# Patient Record
Sex: Male | Born: 1975 | Race: White | Hispanic: No | Marital: Married | Smoking: Never smoker
Health system: Southern US, Community
[De-identification: ages and names within clinical notes are randomized; demographics above are authoritative.]

## PROBLEM LIST (undated history)

## (undated) DIAGNOSIS — I4891 Unspecified atrial fibrillation: Secondary | ICD-10-CM

## (undated) DIAGNOSIS — K219 Gastro-esophageal reflux disease without esophagitis: Secondary | ICD-10-CM

## (undated) DIAGNOSIS — J302 Other seasonal allergic rhinitis: Secondary | ICD-10-CM

## (undated) DIAGNOSIS — I10 Essential (primary) hypertension: Secondary | ICD-10-CM

## (undated) HISTORY — DX: Other seasonal allergic rhinitis: J30.2

## (undated) HISTORY — DX: Unspecified atrial fibrillation: I48.91

## (undated) HISTORY — DX: Gastro-esophageal reflux disease without esophagitis: K21.9

## (undated) HISTORY — PX: WISDOM TOOTH EXTRACTION: SHX21

## (undated) HISTORY — DX: Essential (primary) hypertension: I10

---

## 2016-03-08 MED FILL — AZITHROMYCIN 500 MG TABLET: 500 | 3 days supply | Qty: 3 | Fill #0

## 2018-06-21 DIAGNOSIS — R82998 Other abnormal findings in urine: Secondary | ICD-10-CM | POA: Diagnosis not present

## 2018-06-29 ENCOUNTER — Other Ambulatory Visit: Payer: Self-pay | Admitting: Internal Medicine

## 2018-06-29 DIAGNOSIS — Z8249 Family history of ischemic heart disease and other diseases of the circulatory system: Secondary | ICD-10-CM

## 2018-07-23 ENCOUNTER — Other Ambulatory Visit: Payer: Self-pay

## 2018-08-10 ENCOUNTER — Ambulatory Visit
Admission: RE | Admit: 2018-08-10 | Discharge: 2018-08-10 | Disposition: A | Discharge status: Home / Self Care | Payer: No Typology Code available for payment source | Source: Ambulatory Visit | Attending: Internal Medicine | Admitting: Internal Medicine

## 2018-08-10 DIAGNOSIS — Z8249 Family history of ischemic heart disease and other diseases of the circulatory system: Secondary | ICD-10-CM

## 2020-10-31 ENCOUNTER — Ambulatory Visit: Payer: Self-pay

## 2020-10-31 DIAGNOSIS — Z23 Encounter for immunization: Secondary | ICD-10-CM

## 2020-11-25 ENCOUNTER — Other Ambulatory Visit (HOSPITAL_COMMUNITY): Payer: Self-pay | Admitting: Internal Medicine

## 2020-11-25 DIAGNOSIS — I4891 Unspecified atrial fibrillation: Secondary | ICD-10-CM

## 2020-11-26 ENCOUNTER — Ambulatory Visit (HOSPITAL_COMMUNITY): Payer: No Typology Code available for payment source | Attending: Cardiology

## 2020-11-26 ENCOUNTER — Other Ambulatory Visit: Payer: Self-pay

## 2020-11-26 DIAGNOSIS — I4891 Unspecified atrial fibrillation: Secondary | ICD-10-CM

## 2020-11-26 LAB — ECHOCARDIOGRAM COMPLETE
Area-P 1/2: 4.19 cm2
S' Lateral: 3.1 cm

## 2021-03-16 IMAGING — CT CT HEART SCORING
2 of 3 series · 10 of 20 positions shown, 12 images · non-contrast
Comparison: None.

CLINICAL DATA: Family history of heart disease.  Hypertension.

EXAM:
CT HEART FOR CALCIUM SCORING
TECHNIQUE: CT heart was performed using prospective ECG gating.
A non-contrast exam for calcium scoring was performed.
Note that this exam targets the heart and the chest was not imaged
in its entirety.

[Series 3: calcium scoring 2.00 br40 bestdiast 68% fov · axial · 0.51mm/px · z∈[+1490,+1582]mm · 5 of 70 slices shown, 7 images]
[im 12/70  vessel]
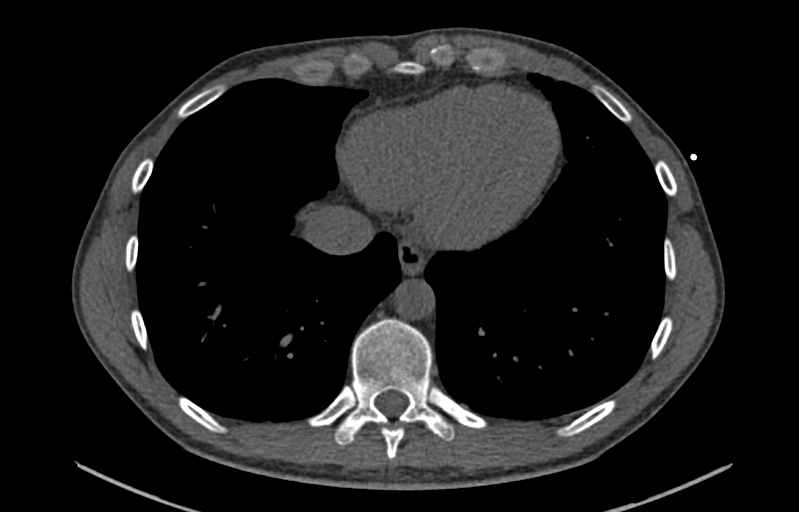
[im 12/70  lung]
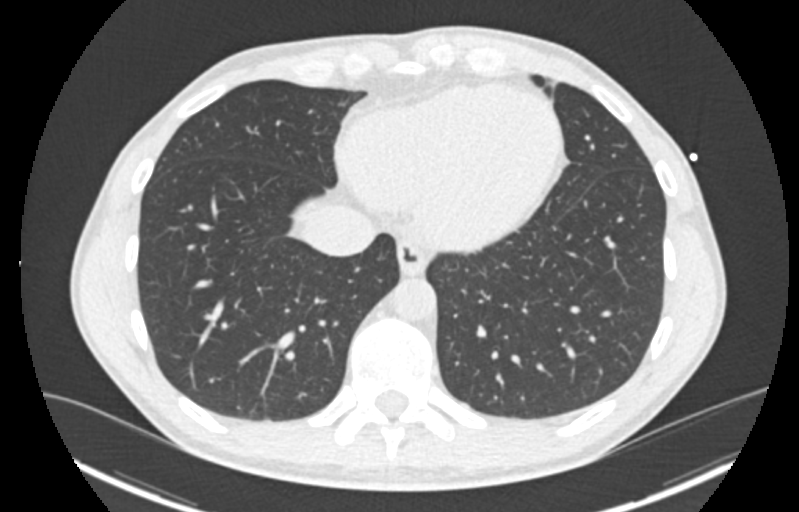
[im 24/70  vessel]
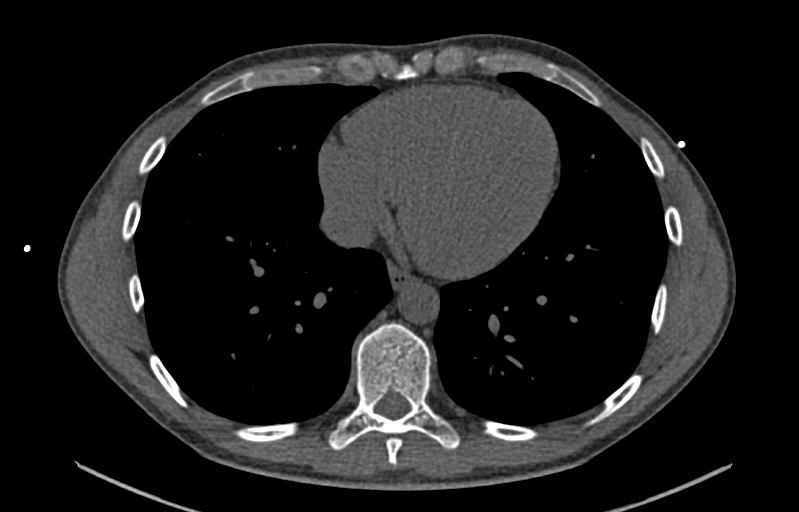
[im 35/70  vessel]
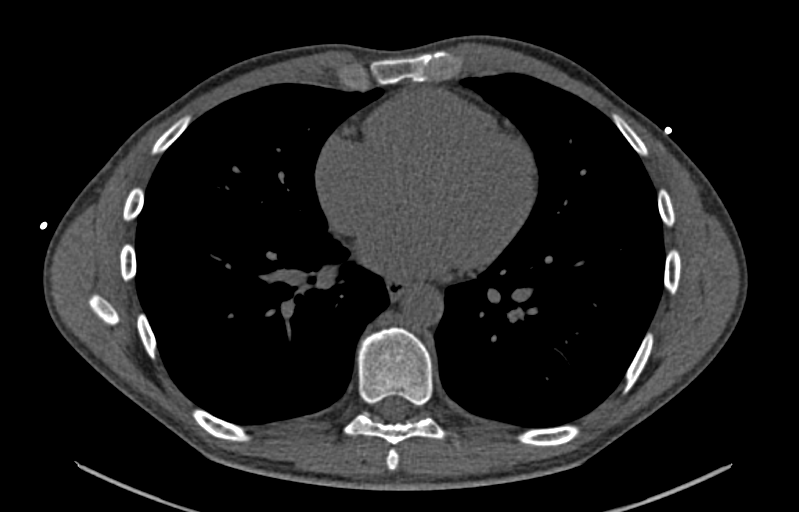
[im 47/70  vessel]
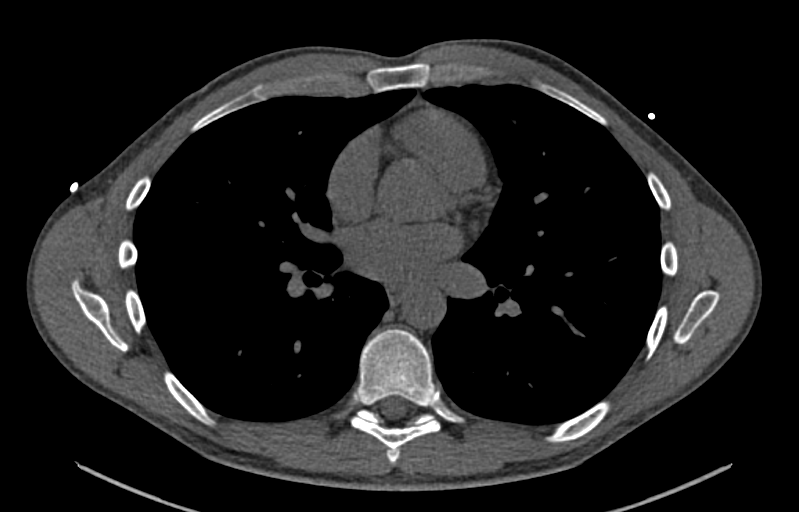
[im 58/70  vessel]
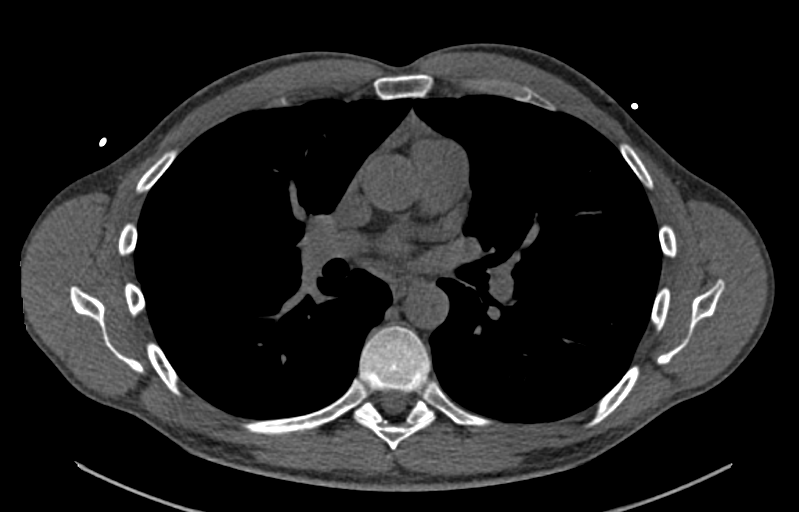
[im 58/70  lung]
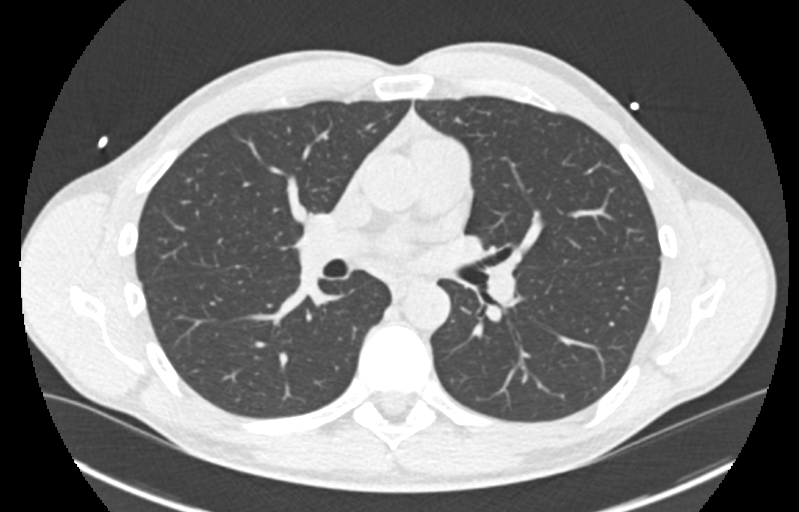

[Series 9: calcium scoring 2.00 br60 bestdiast 68% fov · axial · 0.49mm/px · z∈[+1490,+1582]mm · 5 of 70 slices shown]
[im 12/70  vessel]
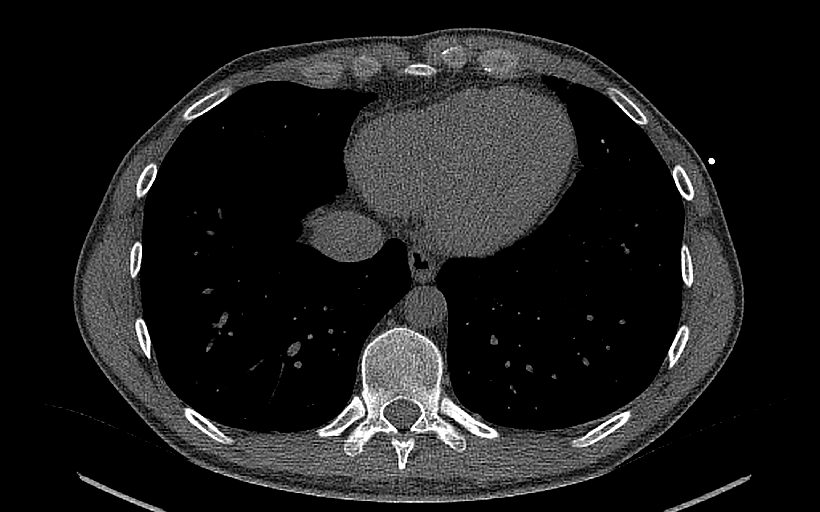
[im 24/70  vessel]
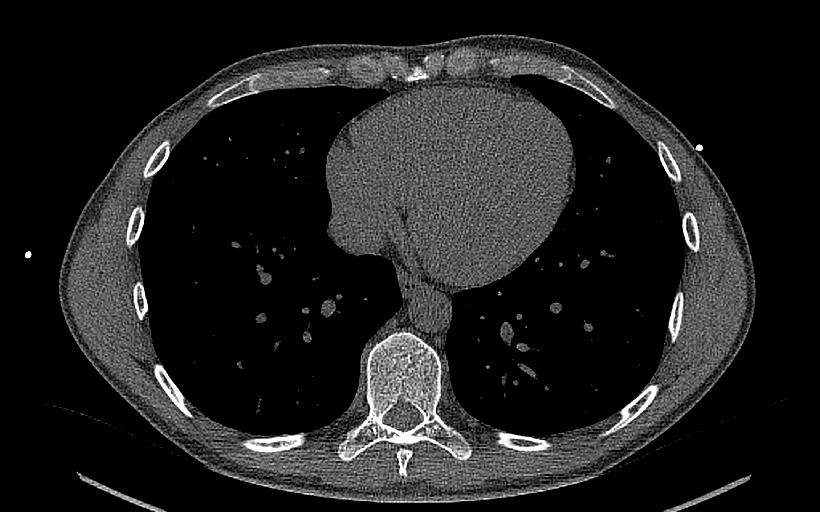
[im 35/70  vessel]
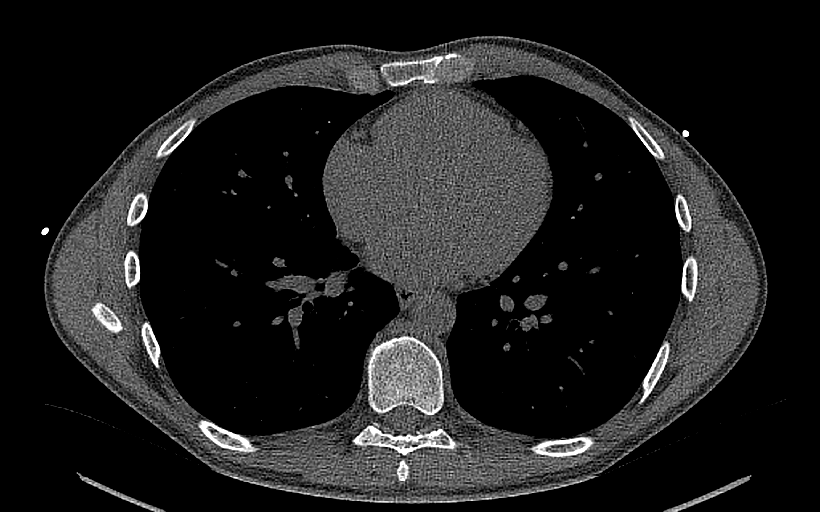
[im 47/70  vessel]
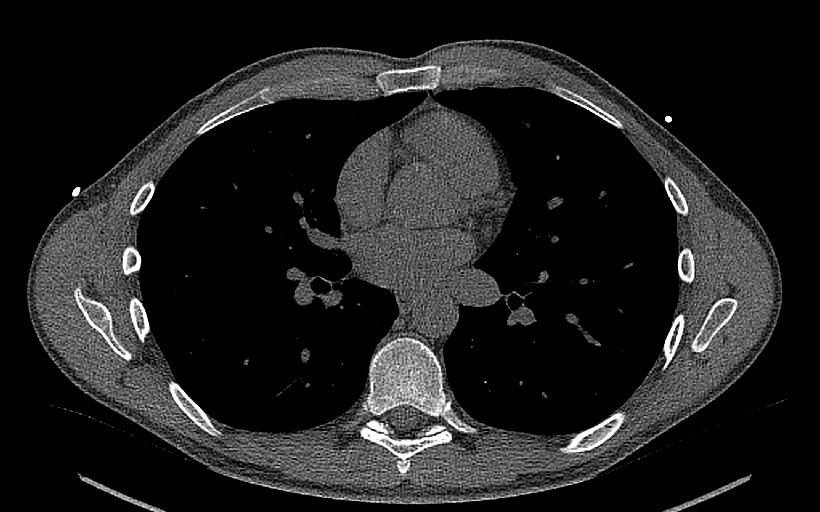
[im 58/70  vessel]
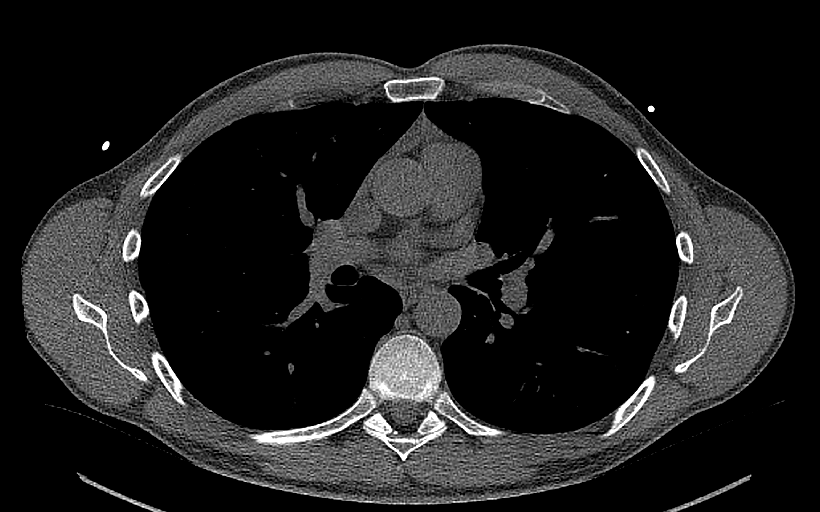

[10 of 20 positions shown; findings below may reference images not displayed]

FINDINGS: Technical quality: Good.

CORONARY CALCIUM

Total Agatston Score: 0-no coronary calcium identified.

OTHER FINDINGS:

Cardiovascular: Normal aortic caliber. Normal heart size, without
pericardial effusion.

Mediastinum/Nodes: No imaged thoracic adenopathy. Fluid level in the
esophagus on 58/3.

Lungs/Pleura: No imaged pleural fluid.  Clear imaged lungs.

Upper Abdomen: Normal imaged portions of the liver, spleen.

Musculoskeletal: Midthoracic spondylosis.
IMPRESSION: 1. No coronary calcium identified.
2. Esophageal air fluid level suggests dysmotility or
gastroesophageal reflux.

## 2021-03-30 ENCOUNTER — Encounter: Payer: Self-pay | Admitting: Internal Medicine

## 2021-05-28 ENCOUNTER — Ambulatory Visit (AMBULATORY_SURGERY_CENTER): Payer: 59

## 2021-05-28 VITALS — Ht 72.0 in | Wt 165.0 lb

## 2021-05-28 DIAGNOSIS — Z1211 Encounter for screening for malignant neoplasm of colon: Secondary | ICD-10-CM

## 2021-05-28 MED ORDER — NA SULFATE-K SULFATE-MG SULF 17.5-3.13-1.6 GM/177ML PO SOLN: 1.0000 | Freq: Once | ORAL | 0 refills | Status: AC

## 2021-05-28 NOTE — Progress Notes (Signed)
No egg or soy allergy known to patient  ?No issues known to pt with past sedation with any surgeries or procedures ?Patient denies ever being told they had issues or difficulty with intubation  ?No FH of Malignant Hyperthermia ?Pt is not on diet pills ?Pt is not on home 02  ?Pt is not on blood thinners  ?Pt denies issues with constipation  ?HX OF AFIB ?NO PA's for preps discussed with pt in PV today  ?Discussed with pt there will be an out-of-pocket cost for prep and that varies from $0 to 70 + dollars - pt verbalized understanding  ?Pt instructed to use Singlecare.com or GoodRx for a price reduction on prep  ?PV completed over the phone. Pt verified name, DOB, address and insurance during PV today.  ?Pt mailed instruction packet with copy of consent form to read and not return, and instructions.  ?Pt encouraged to call with questions or issues.  ? ?Insurance confirmed with pt at [facility] today  ? ?

## 2021-06-25 ENCOUNTER — Ambulatory Visit (AMBULATORY_SURGERY_CENTER): Payer: 59 | Admitting: Gastroenterology

## 2021-06-25 ENCOUNTER — Encounter: Payer: Self-pay | Admitting: Gastroenterology

## 2021-06-25 VITALS — BP 110/73 | HR 66 | Temp 97.1°F | Resp 10 | Ht 72.0 in | Wt 165.0 lb

## 2021-06-25 DIAGNOSIS — Z1211 Encounter for screening for malignant neoplasm of colon: Secondary | ICD-10-CM

## 2021-06-25 DIAGNOSIS — D124 Benign neoplasm of descending colon: Secondary | ICD-10-CM

## 2021-06-25 DIAGNOSIS — D127 Benign neoplasm of rectosigmoid junction: Secondary | ICD-10-CM | POA: Diagnosis not present

## 2021-06-25 DIAGNOSIS — D125 Benign neoplasm of sigmoid colon: Secondary | ICD-10-CM

## 2021-06-25 DIAGNOSIS — D128 Benign neoplasm of rectum: Secondary | ICD-10-CM

## 2021-06-25 MED ORDER — SODIUM CHLORIDE 0.9 % IV SOLN: 500.0000 mL | Freq: Once | INTRAVENOUS | Status: DC

## 2021-06-25 NOTE — Progress Notes (Signed)
Called to room to assist during endoscopic procedure.  Patient ID and intended procedure confirmed with present staff. Received instructions for my participation in the procedure from the performing physician.  

## 2021-06-25 NOTE — Progress Notes (Signed)
PT taken to PACU. Monitors in place. VSS. Report given to RN. 

## 2021-06-25 NOTE — Progress Notes (Signed)
I have reviewed the patient's medical history in detail and updated the computerized patient record.   VS BY DT. 

## 2021-06-25 NOTE — Patient Instructions (Signed)
Resume previous diet and medications. Awaiting pathology results. Repeat Colonoscopy date to be determined based on pathology results.  YOU HAD AN ENDOSCOPIC PROCEDURE TODAY AT [facility]:   Refer to the procedure report that was given to you for any specific questions about what was found during the examination.  If the procedure report does not answer your questions, please call your gastroenterologist to clarify.  If you requested that your care partner not be given the details of your procedure findings, then the procedure report has been included in a sealed envelope for you to review at your convenience later.  YOU SHOULD EXPECT: Some feelings of bloating in the abdomen. Passage of more gas than usual.  Walking can help get rid of the air that was put into your GI tract during the procedure and reduce the bloating. If you had a lower endoscopy (such as a colonoscopy or flexible sigmoidoscopy) you may notice spotting of blood in your stool or on the toilet paper. If you underwent a bowel prep for your procedure, you may not have a normal bowel movement for a few days.  Please Note:  You might notice some irritation and congestion in your nose or some drainage.  This is from the oxygen used during your procedure.  There is no need for concern and it should clear up in a day or so.  SYMPTOMS TO REPORT IMMEDIATELY:  Following lower endoscopy (colonoscopy or flexible sigmoidoscopy):  Excessive amounts of blood in the stool  Significant tenderness or worsening of abdominal pains  Swelling of the abdomen that is new, acute  Fever of 100F or higher  For urgent or emergent issues, a gastroenterologist can be reached at any hour by calling [phone]. Do not use MyChart messaging for urgent concerns.    DIET:  We do recommend a small meal at first, but then you may proceed to your regular diet.  Drink plenty of fluids but you should avoid alcoholic beverages for 24  hours.  ACTIVITY:  You should plan to take it easy for the rest of today and you should NOT DRIVE or use heavy machinery until tomorrow (because of the sedation medicines used during the test).    FOLLOW UP: Our staff will call the number listed on your records 24-72 hours following your procedure to check on you and address any questions or concerns that you may have regarding the information given to you following your procedure. If we do not reach you, we will leave a message.  We will attempt to reach you two times.  During this call, we will ask if you have developed any symptoms of COVID 19. If you develop any symptoms (ie: fever, flu-like symptoms, shortness of breath, cough etc.) before then, please call [phone].  If you test positive for Covid 19 in the 2 weeks post procedure, please call and report this information to [place].    If any biopsies were taken you will be contacted by phone or by letter within the next 1-3 weeks.  Please call us at [phone] if you have not heard about the biopsies in 3 weeks.    SIGNATURES/CONFIDENTIALITY: You and/or your care partner have signed paperwork which will be entered into your electronic medical record.  These signatures attest to the fact that that the information above on your After Visit Summary has been reviewed and is understood.  Full responsibility of the confidentiality of this discharge information lies with you and/or your care-partner.

## 2021-06-25 NOTE — Progress Notes (Signed)
[facility] History and Physical   Primary Care Physician:  Ginger Organ., MD   Reason for Procedure:   Colon cancer screening  Plan:    Screening colonoscopy     HPI: John Hunter is a 46 y.o. male undergoing initial average risk screening colonoscopy.  He has no family history of colon cancer and no chronic GI symptoms.    Past Medical History:  Diagnosis Date   A-fib ([place])    hx of- not on daily meds   GERD (gastroesophageal reflux disease)    on meds   Hypertension    on meds   Seasonal allergies     Past Surgical History:  Procedure Laterality Date   WISDOM TOOTH EXTRACTION      Prior to Admission medications   Medication Sig Start Date End Date Taking? Authorizing Provider  esomeprazole (NEXIUM) 20 MG capsule Take 20 mg by mouth daily at 12 noon.   Yes [provider]  olmesartan (BENICAR) 5 MG tablet Take 1 tablet by mouth daily at 6 (six) AM.   Yes [provider]  metoprolol tartrate (LOPRESSOR) 25 MG tablet Take 1 tablet by mouth daily as needed. 06/08/15   [provider]    Current Outpatient Medications  Medication Sig Dispense Refill   esomeprazole (NEXIUM) 20 MG capsule Take 20 mg by mouth daily at 12 noon.     olmesartan (BENICAR) 5 MG tablet Take 1 tablet by mouth daily at 6 (six) AM.     metoprolol tartrate (LOPRESSOR) 25 MG tablet Take 1 tablet by mouth daily as needed.     Current Facility-Administered Medications  Medication Dose Route Frequency Provider Last Rate Last Admin   0.9 %  sodium chloride infusion  500 mL Intravenous Once Daryel November, MD        Allergies as of 06/25/2021   (No Known Allergies)    Family History  Problem Relation Age of Onset   Pancreatic cancer Mother 26   Colon polyps Mother 23   Colon cancer Neg Hx    Esophageal cancer Neg Hx    Rectal cancer Neg Hx    Stomach cancer Neg Hx     Social History   Socioeconomic History   Marital status: Married     Spouse name: Not on file   Number of children: Not on file   Years of education: Not on file   Highest education level: Not on file  Occupational History   Not on file  Tobacco Use   Smoking status: Never   Smokeless tobacco: Never  Vaping Use   Vaping Use: Never used  Substance and Sexual Activity   Alcohol use: Not Currently    Alcohol/week: 0.0 - 5.0 standard drinks of alcohol   Drug use: Never   Sexual activity: Not on file  Other Topics Concern   Not on file  Social History Narrative   Not on file   Social Determinants of Health   Financial Resource Strain: Not on file  Food Insecurity: Not on file  Transportation Needs: Not on file  Physical Activity: Not on file  Stress: Not on file  Social Connections: Not on file  Intimate Partner Violence: Not on file    Review of Systems:  All other review of systems negative except as mentioned in the HPI.  Physical Exam: Vital signs BP (!) 103/55   Pulse 70   Temp (!) 97.1 F (36.2 C) (Temporal)   Ht 6' (1.829 m)  Wt 165 lb (74.8 kg)   SpO2 100%   BMI 22.38 kg/m   General:   Alert,  Well-developed, well-nourished, pleasant and cooperative in NAD Airway:  Mallampati 1 Lungs:  Clear throughout to auscultation.   Heart:  Regular rate and rhythm; no murmurs, clicks, rubs,  or gallops. Abdomen:  Soft, nontender and nondistended. Normal bowel sounds.   Neuro/Psych:  Normal mood and affect. A and O x 3   Gen Clagg E. Candis Schatz, MD [facility]

## 2021-06-25 NOTE — Op Note (Signed)
[facility] Patient Name: John Hunter Procedure Date: 06/25/2021 8:58 AM MRN: [id number] Endoscopist: Nicki Reaper E. Candis Schatz , MD Age: 46 Referring MD:  Date of Birth: May 31, 1975 Gender: Male Account #: [id number] Procedure:                Colonoscopy Indications:              Screening for colorectal malignant neoplasm, This                            is the patient's first colonoscopy Medicines:                Monitored Anesthesia Care Procedure:                Pre-Anesthesia Assessment:                           - Prior to the procedure, a History and Physical                            was performed, and patient medications and                            allergies were reviewed. The patient's tolerance of                            previous anesthesia was also reviewed. The risks                            and benefits of the procedure and the sedation                            options and risks were discussed with the patient.                            All questions were answered, and informed consent                            was obtained. Prior Anticoagulants: The patient has                            taken no previous anticoagulant or antiplatelet                            agents. ASA Grade Assessment: II - A patient with                            mild systemic disease. After reviewing the risks                            and benefits, the patient was deemed in                            satisfactory condition to undergo the procedure.  After obtaining informed consent, the colonoscope                            was passed under direct vision. Throughout the                            procedure, the patient's blood pressure, pulse, and                            oxygen saturations were monitored continuously. The                            Olympus CF-HQ190L [id number]) Colonoscope was                            introduced through the anus  and advanced to the the                            terminal ileum, with identification of the                            appendiceal orifice and IC valve. The colonoscopy                            was performed without difficulty. The patient                            tolerated the procedure well. The quality of the                            bowel preparation was excellent. The terminal                            ileum, ileocecal valve, appendiceal orifice, and                            rectum were photographed. The bowel preparation                            used was SUPREP via split dose instruction. Scope In: 9:13:53 AM Scope Out: 9:34:41 AM Scope Withdrawal Time: 0 hours 15 minutes 19 seconds  Total Procedure Duration: 0 hours 20 minutes 48 seconds  Findings:                 The perianal and digital rectal examinations were                            normal. Pertinent negatives include normal                            sphincter tone and no palpable rectal lesions.                           A 3 mm polyp was found in the descending colon. The  polyp was sessile. The polyp was removed with a                            cold snare. Resection and retrieval were complete.                            Estimated blood loss was minimal.                           A 8 mm polyp was found in the sigmoid colon. The                            polyp was semi-pedunculated. The polyp was removed                            with a cold snare. Resection and retrieval were                            complete. Estimated blood loss was minimal.                           A 3 mm polyp was found in the rectum. The polyp was                            sessile. The polyp was removed with a cold snare.                            Resection and retrieval were complete. Estimated                            blood loss was minimal.                           The exam was otherwise normal  throughout the                            examined colon.                           The terminal ileum appeared normal.                           The retroflexed view of the distal rectum and anal                            verge was normal and showed no anal or rectal                            abnormalities. Complications:            No immediate complications. Estimated Blood Loss:     Estimated blood loss was minimal. Impression:               - One 3 mm polyp in the descending colon, removed  with a cold snare. Resected and retrieved.                           - One 8 mm polyp in the sigmoid colon, removed with                            a cold snare. Resected and retrieved.                           - One 3 mm polyp in the rectum, removed with a cold                            snare. Resected and retrieved.                           - The examined portion of the ileum was normal.                           - The distal rectum and anal verge are normal on                            retroflexion view. Recommendation:           - Patient has a contact number available for                            emergencies. The signs and symptoms of potential                            delayed complications were discussed with the                            patient. Return to normal activities tomorrow.                            Written discharge instructions were provided to the                            patient.                           - Resume previous diet.                           - Continue present medications.                           - Await pathology results.                           - Repeat colonoscopy (date not yet determined) for                            surveillance based on pathology results. Terral Cooks E. Candis Schatz, MD 06/25/2021 9:38:50 AM This report has been signed electronically.

## 2021-07-01 ENCOUNTER — Encounter: Payer: Self-pay | Admitting: Gastroenterology

## 2022-05-20 ENCOUNTER — Other Ambulatory Visit (HOSPITAL_COMMUNITY): Payer: Self-pay

## 2022-05-20 MED ORDER — MELOXICAM 15 MG PO TABS
15.0000 mg | ORAL_TABLET | Freq: Every day | ORAL | 10 refills | Status: AC
Start: 2022-05-11 — End: ?
  Filled 2022-05-20: qty 30, 30d supply, fill #0
  Filled 2022-10-09: qty 30, 30d supply, fill #1
  Filled 2022-11-13: qty 30, 30d supply, fill #2
  Filled 2023-04-08: qty 30, 30d supply, fill #3

## 2022-05-20 MED ORDER — OLMESARTAN MEDOXOMIL 5 MG PO TABS
5.0000 mg | ORAL_TABLET | Freq: Every day | ORAL | 2 refills | Status: AC
Start: 2022-05-11 — End: ?
  Filled 2022-05-20: qty 90, 90d supply, fill #0

## 2022-05-20 MED ORDER — OLMESARTAN MEDOXOMIL 5 MG PO TABS
5.0000 mg | ORAL_TABLET | Freq: Every day | ORAL | 2 refills | Status: AC
Start: 2022-03-14 — End: ?

## 2022-05-21 ENCOUNTER — Other Ambulatory Visit (HOSPITAL_COMMUNITY): Payer: Self-pay

## 2022-05-23 ENCOUNTER — Other Ambulatory Visit (HOSPITAL_COMMUNITY): Payer: Self-pay

## 2022-09-13 ENCOUNTER — Other Ambulatory Visit (HOSPITAL_COMMUNITY): Payer: Self-pay

## 2022-09-13 MED ORDER — METOPROLOL TARTRATE 25 MG PO TABS
25.0000 mg | ORAL_TABLET | Freq: Two times a day (BID) | ORAL | 11 refills | Status: DC
  Filled 2022-09-13: qty 60, 30d supply, fill #0
  Filled 2022-10-09: qty 60, 30d supply, fill #1
  Filled 2022-11-13: qty 60, 30d supply, fill #2
  Filled 2022-12-09: qty 60, 30d supply, fill #3
  Filled 2023-01-08: qty 60, 30d supply, fill #4
  Filled 2023-02-07: qty 60, 30d supply, fill #5
  Filled 2023-03-13: qty 60, 30d supply, fill #6
  Filled 2023-04-08: qty 60, 30d supply, fill #7
  Filled 2023-05-06: qty 60, 30d supply, fill #8
  Filled 2023-06-06: qty 60, 30d supply, fill #9
  Filled 2023-07-06: qty 60, 30d supply, fill #10
  Filled 2023-08-08: qty 60, 30d supply, fill #11

## 2022-09-14 ENCOUNTER — Other Ambulatory Visit (HOSPITAL_COMMUNITY): Payer: Self-pay

## 2022-10-11 ENCOUNTER — Other Ambulatory Visit (HOSPITAL_COMMUNITY): Payer: Self-pay

## 2022-10-11 ENCOUNTER — Other Ambulatory Visit: Payer: Self-pay

## 2022-10-11 ENCOUNTER — Encounter: Payer: Self-pay | Admitting: Pharmacist

## 2023-05-10 ENCOUNTER — Other Ambulatory Visit (HOSPITAL_COMMUNITY): Payer: Self-pay

## 2023-05-10 MED ORDER — FINASTERIDE 1 MG PO TABS
1.0000 mg | ORAL_TABLET | Freq: Every day | ORAL | 11 refills | Status: AC
  Filled 2023-05-10 (×2): qty 30, 30d supply, fill #0
  Filled 2023-06-06: qty 30, 30d supply, fill #1
  Filled 2023-07-06: qty 30, 30d supply, fill #2
  Filled 2023-08-08: qty 30, 30d supply, fill #3
  Filled 2023-09-03: qty 30, 30d supply, fill #4
  Filled 2023-10-03: qty 30, 30d supply, fill #5
  Filled 2023-11-15: qty 30, 30d supply, fill #6
  Filled 2023-12-10: qty 30, 30d supply, fill #7
  Filled 2024-01-10: qty 30, 30d supply, fill #8
  Filled 2024-02-15: qty 30, 30d supply, fill #9

## 2023-06-06 ENCOUNTER — Other Ambulatory Visit (HOSPITAL_COMMUNITY): Payer: Self-pay

## 2023-07-06 ENCOUNTER — Other Ambulatory Visit (HOSPITAL_COMMUNITY): Payer: Self-pay

## 2023-07-07 ENCOUNTER — Other Ambulatory Visit: Payer: Self-pay

## 2023-08-08 ENCOUNTER — Other Ambulatory Visit (HOSPITAL_COMMUNITY): Payer: Self-pay

## 2023-08-30 ENCOUNTER — Other Ambulatory Visit: Payer: Self-pay

## 2023-08-30 ENCOUNTER — Other Ambulatory Visit (HOSPITAL_BASED_OUTPATIENT_CLINIC_OR_DEPARTMENT_OTHER): Payer: Self-pay

## 2023-08-30 ENCOUNTER — Other Ambulatory Visit (HOSPITAL_COMMUNITY): Payer: Self-pay

## 2023-08-30 MED ORDER — CLOBETASOL PROPIONATE 0.05 % EX SHAM
1.0000 | MEDICATED_SHAMPOO | Freq: Every day | CUTANEOUS | 1 refills | Status: AC
  Filled 2023-08-30 (×2): qty 118, 30d supply, fill #0
  Filled 2024-02-15: qty 118, 30d supply, fill #1

## 2023-08-30 MED ORDER — CLOBETASOL PROPIONATE 0.05 % EX SHAM
1.0000 | MEDICATED_SHAMPOO | Freq: Every day | CUTANEOUS | 1 refills | Status: AC
  Filled 2023-08-30 (×2): qty 118, 30d supply, fill #0

## 2023-09-01 ENCOUNTER — Other Ambulatory Visit (HOSPITAL_COMMUNITY): Payer: Self-pay

## 2023-09-01 MED ORDER — METOPROLOL TARTRATE 25 MG PO TABS
25.0000 mg | ORAL_TABLET | Freq: Two times a day (BID) | ORAL | 3 refills | Status: AC
  Filled 2023-09-01 (×2): qty 180, 90d supply, fill #0
  Filled 2023-12-10: qty 180, 90d supply, fill #1

## 2023-09-04 ENCOUNTER — Other Ambulatory Visit (HOSPITAL_COMMUNITY): Payer: Self-pay

## 2023-10-03 ENCOUNTER — Other Ambulatory Visit (HOSPITAL_COMMUNITY): Payer: Self-pay

## 2023-11-15 ENCOUNTER — Other Ambulatory Visit (HOSPITAL_COMMUNITY): Payer: Self-pay

## 2023-12-11 ENCOUNTER — Other Ambulatory Visit (HOSPITAL_COMMUNITY): Payer: Self-pay

## 2024-01-10 ENCOUNTER — Other Ambulatory Visit (HOSPITAL_COMMUNITY): Payer: Self-pay

## 2024-02-20 ENCOUNTER — Other Ambulatory Visit: Payer: Self-pay | Admitting: Internal Medicine

## 2024-02-20 DIAGNOSIS — E785 Hyperlipidemia, unspecified: Secondary | ICD-10-CM
